# Patient Record
Sex: Male | Born: 1995 | Hispanic: Yes | Marital: Single | Smoking: Former smoker
Health system: Southern US, Community
[De-identification: ages and names within clinical notes are randomized; demographics above are authoritative.]

---
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
## 2019-09-25 ENCOUNTER — Emergency Department (HOSPITAL_COMMUNITY)
Admission: EM | Admit: 2019-09-25 | Discharge: 2019-09-25 | Disposition: A | Discharge status: Home / Self Care | Payer: Self-pay | Attending: Emergency Medicine | Admitting: Emergency Medicine

## 2019-09-25 ENCOUNTER — Other Ambulatory Visit: Payer: Self-pay

## 2019-09-25 ENCOUNTER — Encounter (HOSPITAL_COMMUNITY): Payer: Self-pay | Admitting: Emergency Medicine

## 2019-09-25 ENCOUNTER — Emergency Department (HOSPITAL_COMMUNITY): Payer: Self-pay

## 2019-09-25 ENCOUNTER — Encounter (HOSPITAL_COMMUNITY): Payer: Self-pay

## 2019-09-25 DIAGNOSIS — F17210 Nicotine dependence, cigarettes, uncomplicated: Secondary | ICD-10-CM | POA: Insufficient documentation

## 2019-09-25 DIAGNOSIS — N50819 Testicular pain, unspecified: Secondary | ICD-10-CM | POA: Insufficient documentation

## 2019-09-25 DIAGNOSIS — N50812 Left testicular pain: Secondary | ICD-10-CM | POA: Insufficient documentation

## 2019-09-25 DIAGNOSIS — N50811 Right testicular pain: Secondary | ICD-10-CM | POA: Insufficient documentation

## 2019-09-25 DIAGNOSIS — Z5321 Procedure and treatment not carried out due to patient leaving prior to being seen by health care provider: Secondary | ICD-10-CM | POA: Insufficient documentation

## 2019-09-25 LAB — URINALYSIS, ROUTINE W REFLEX MICROSCOPIC
Bilirubin Urine: NEGATIVE
Glucose, UA: NEGATIVE mg/dL
Hgb urine dipstick: NEGATIVE
Ketones, ur: NEGATIVE mg/dL
Leukocytes,Ua: NEGATIVE
Nitrite: NEGATIVE
Protein, ur: NEGATIVE mg/dL
Specific Gravity, Urine: 1.017 (ref 1.005–1.030)
pH: 6 (ref 5.0–8.0)

## 2019-09-25 LAB — CBC WITH DIFFERENTIAL/PLATELET
Abs Immature Granulocytes: 0.03 10*3/uL (ref 0.00–0.07)
Basophils Absolute: 0 10*3/uL (ref 0.0–0.1)
Basophils Relative: 1 %
Eosinophils Absolute: 0.1 10*3/uL (ref 0.0–0.5)
Eosinophils Relative: 1 %
HCT: 43.1 % (ref 39.0–52.0)
Hemoglobin: 12.8 g/dL — ABNORMAL LOW (ref 13.0–17.0)
Immature Granulocytes: 0 %
Lymphocytes Relative: 21 %
Lymphs Abs: 1.8 10*3/uL (ref 0.7–4.0)
MCH: 25 pg — ABNORMAL LOW (ref 26.0–34.0)
MCHC: 29.7 g/dL — ABNORMAL LOW (ref 30.0–36.0)
MCV: 84 fL (ref 80.0–100.0)
Monocytes Absolute: 0.7 10*3/uL (ref 0.1–1.0)
Monocytes Relative: 8 %
Neutro Abs: 5.9 10*3/uL (ref 1.7–7.7)
Neutrophils Relative %: 69 %
Platelets: 230 10*3/uL (ref 150–400)
RBC: 5.13 MIL/uL (ref 4.22–5.81)
RDW: 14.1 % (ref 11.5–15.5)
WBC: 8.5 10*3/uL (ref 4.0–10.5)
nRBC: 0 % (ref 0.0–0.2)

## 2019-09-25 LAB — BASIC METABOLIC PANEL
Anion gap: 7 (ref 5–15)
BUN: 8 mg/dL (ref 6–20)
CO2: 24 mmol/L (ref 22–32)
Calcium: 9 mg/dL (ref 8.9–10.3)
Chloride: 107 mmol/L (ref 98–111)
Creatinine, Ser: 0.79 mg/dL (ref 0.61–1.24)
GFR calc Af Amer: >60 mL/min (ref >60)
GFR calc non Af Amer: >60 mL/min (ref >60)
Glucose, Bld: 85 mg/dL (ref 70–99)
Potassium: 4.1 mmol/L (ref 3.5–5.1)
Sodium: 138 mmol/L (ref 135–145)

## 2019-09-25 NOTE — ED Triage Notes (Signed)
Onset 4 days ago developed bilateral groin pain and testicle pain. States pain and swelling intermittent. Currently pain 0/10. Denies any urinate or bowel movement complaints.

## 2019-09-25 NOTE — Discharge Instructions (Addendum)
Your blood work was reassuring, your ultrasound did not show any evidence of a testicular torsion or masses, there was a small left epididymal cyst and a small right testicular cyst.  Follow-up with urologist if your pain continues.  In the meantime you can take ibuprofen as prescribed on the bottle for pain.  Get plenty of rest and drink plenty of water.  Please come back to the emergency department for any new worsening concerning symptoms.

## 2019-09-25 NOTE — ED Notes (Signed)
Pt decided to go home 

## 2019-09-25 NOTE — ED Triage Notes (Addendum)
Patient c/o bilateral testicular pain x 2 days. Patient states he had swelling as well 2 days ago, but not now. Patient denies any penile drainage, urinary symptoms, or fever.  Patient was at [facility] last night and had [place] and blood work completed. Patient left AMA due to wait times.

## 2019-09-25 NOTE — ED Provider Notes (Signed)
[facility]-EMERGENCY DEPT Provider Note   CSN: [id number] Arrival date & time: 09/25/19  1351     History Chief Complaint  Patient presents with  . Testicle Pain    Damon Leon is a 24 y.o. male with no prior past medical history that presents the emergency department today for testicular pain for the past week.  Went to [facility] this morning and got blood work and ultrasound done which were both negative.  Did not wait to be seen due to wait times.  States that he had swelling in bilateral testicle 2 days ago, swelling went away yesterday.  Denies any color change, warmth, rash, penile discharge, scrotal masses, nausea, vomiting, chills, fever.  Denies any trauma to the area.  Denies any urinary symptoms, dysuria, hematuria.  States that he is a normal health for this.  Has not been taking any medications for this.  Denies any worsening factors, states that putting a pillow between his legs makes him feel better.  States he has never had pain like this before.  Describes the pain as a sharp pain that comes and goes.  Is not experiencing the pain currently.  States that it only in his testicles.  Does not radiate anywhere.  States that the pain has been lessening over the week.  Denies any chance of STDs, states that he has not had intercourse for over 2 years.  HPI     History reviewed. No pertinent past medical history.  There are no problems to display for this patient.   History reviewed. No pertinent surgical history.     History reviewed. No pertinent family history.  Social History   Tobacco Use  . Smoking status: Current Some Day Smoker    Types: Cigarettes  . Smokeless tobacco: Never Used  Vaping Use  . Vaping Use: Never used  Substance Use Topics  . Alcohol use: No  . Drug use: No    Home Medications Prior to Admission medications   Not on File    Allergies    Patient has no known allergies.  Review of Systems   Review of  Systems  Constitutional: Negative for diaphoresis, fatigue and fever.  Eyes: Negative for visual disturbance.  Respiratory: Negative for shortness of breath.   Cardiovascular: Negative for chest pain.  Gastrointestinal: Negative for nausea and vomiting.  Genitourinary: Positive for testicular pain. Negative for difficulty urinating, discharge, dysuria, enuresis, flank pain, frequency, genital sores, hematuria, penile pain, penile swelling, scrotal swelling and urgency.  Musculoskeletal: Negative for back pain and myalgias.  Skin: Negative for color change, pallor, rash and wound.  Neurological: Negative for syncope, weakness, light-headedness, numbness and headaches.  Psychiatric/Behavioral: Negative for behavioral problems and confusion.    Physical Exam Updated Vital Signs BP 127/78 (BP Location: Right Arm)   Pulse 66   Temp 98.5 F (36.9 C) (Oral)   Resp 15   SpO2 98%   Physical Exam Exam conducted with a chaperone present.  Constitutional:      General: He is not in acute distress.    Appearance: Normal appearance. He is not ill-appearing, toxic-appearing or diaphoretic.  Cardiovascular:     Rate and Rhythm: Normal rate and regular rhythm.     Pulses: Normal pulses.  Pulmonary:     Effort: Pulmonary effort is normal.     Breath sounds: Normal breath sounds.  Abdominal:     Hernia: There is no hernia in the left inguinal area or right inguinal area.  Genitourinary:  Pubic Area: No rash.      Penis: Normal and uncircumcised. No tenderness, discharge or lesions.      Testes: Normal. Cremasteric reflex is present.        Right: Mass or swelling not present.        Left: Mass or swelling not present.     Epididymis:     Right: Normal.     Left: Normal.     Comments: Chaperone present.  Penis normal, no discharge or lesions noted.  Cremaster reflex present.  Testes normal, mild tenderness to bilateral testes.  No erythema or warmth noted.  Only tenderness when pressure  applied.  No tenderness to epididymis bilaterally.  No hernias present bilaterally.  No adenopathy. Musculoskeletal:        General: Normal range of motion.  Lymphadenopathy:     Lower Body: No right inguinal adenopathy. No left inguinal adenopathy.  Skin:    General: Skin is warm and dry.     Capillary Refill: Capillary refill takes less than 2 seconds.  Neurological:     General: No focal deficit present.     Mental Status: He is alert and oriented to person, place, and time.  Psychiatric:        Mood and Affect: Mood normal.        Behavior: Behavior normal.        Thought Content: Thought content normal.     ED Results / Procedures / Treatments   Labs (all labs ordered are listed, but only abnormal results are displayed) Labs Reviewed - No data to display  EKG None  Radiology US SCROTUM W/DOPPLER  Result Date: 09/25/2019 CLINICAL DATA:  Acute bilateral testicular pain. EXAM: SCROTAL ULTRASOUND DOPPLER ULTRASOUND OF THE TESTICLES TECHNIQUE: Complete ultrasound examination of the testicles, epididymis, and other scrotal structures was performed. Color and spectral Doppler ultrasound were also utilized to evaluate blood flow to the testicles. COMPARISON:  None. FINDINGS: Right testicle Measurements: 4.7 x 3.1 x 2.3 cm. No mass or microlithiasis visualized. Several small cysts are noted, the largest measuring 4 mm. Left testicle Measurements: 4.4 x 2.6 x 2.0 cm. No mass or microlithiasis visualized. Right epididymis:  Normal in size and appearance. Left epididymis:  4 mm cyst is noted. Hydrocele:  None visualized. Varicocele:  None visualized. Pulsed Doppler interrogation of both testes demonstrates normal low resistance arterial and venous waveforms bilaterally. IMPRESSION: No evidence of testicular mass or torsion. Small left epididymal cyst. Small right testicular cysts are noted. Electronically Signed   By: Lupita Raider M.D.   On: 09/25/2019 11:32    Procedures Procedures  (including critical care time)  Medications Ordered in ED Medications - No data to display  ED Course  I have reviewed the triage vital signs and the nursing notes.  Pertinent labs & imaging results that were available during my care of the patient were reviewed by me and considered in my medical decision making (see chart for details).    MDM Rules/Calculators/A&P                          Damon Leon is a 24 y.o. male with no prior past medical history that presents the emergency department today for testicular pain for the past week.  Pain has been decreasing over the past week, is not present currently.  Physical exam benign with no signs of testicular torsion, testicular mass, epididymitis.  No signs of infection.  Ultrasound shows  no evidence of testicular mass or torsion, there was small left epididymal cyst and small right testicular cyst.  Urinalysis clear, BMP and CBC without any abnormalities.  Patient rejected STD testing.  Patient follow-up with urologist if testicular pain continues, referral given.  Symptomatic treatment discussed with patient, strict return precautions given.  Patient be discharged.  Patient agreeable.  Doubt need for further emergent work up at this time. I explained the diagnosis and have given explicit precautions to return to the ER including for any other new or worsening symptoms. The patient understands and accepts the medical plan as it's been dictated and I have answered their questions. Discharge instructions concerning home care and prescriptions have been given. The patient is STABLE and is discharged to home in good condition.     Final Clinical Impression(s) / ED Diagnoses Final diagnoses:  Pain in both testicles    Rx / DC Orders ED Discharge Orders    None       Farrel Gordon, PA-C 09/25/19 1543    Charlynne Pander, MD 09/25/19 2322

## 2019-09-26 LAB — URINE CULTURE

## 2019-12-06 ENCOUNTER — Other Ambulatory Visit: Payer: Self-pay

## 2019-12-06 ENCOUNTER — Encounter (HOSPITAL_COMMUNITY): Payer: Self-pay | Admitting: Emergency Medicine

## 2019-12-06 ENCOUNTER — Emergency Department (HOSPITAL_COMMUNITY)
Admission: EM | Admit: 2019-12-06 | Discharge: 2019-12-06 | Disposition: A | Discharge status: Home / Self Care | Payer: Self-pay | Attending: Emergency Medicine | Admitting: Emergency Medicine

## 2019-12-06 DIAGNOSIS — Y92410 Unspecified street and highway as the place of occurrence of the external cause: Secondary | ICD-10-CM | POA: Insufficient documentation

## 2019-12-06 DIAGNOSIS — R519 Headache, unspecified: Secondary | ICD-10-CM | POA: Insufficient documentation
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
## 2019-12-06 NOTE — ED Notes (Signed)
Pt called x 3 .  °

## 2020-03-07 ENCOUNTER — Other Ambulatory Visit: Payer: Self-pay

## 2020-03-07 ENCOUNTER — Emergency Department (HOSPITAL_COMMUNITY)
Admission: EM | Admit: 2020-03-07 | Discharge: 2020-03-07 | Disposition: A | Discharge status: Home / Self Care | Payer: Self-pay | Attending: Emergency Medicine | Admitting: Emergency Medicine

## 2020-03-07 ENCOUNTER — Encounter (HOSPITAL_COMMUNITY): Payer: Self-pay

## 2020-03-07 DIAGNOSIS — Z5321 Procedure and treatment not carried out due to patient leaving prior to being seen by health care provider: Secondary | ICD-10-CM | POA: Insufficient documentation

## 2020-03-07 DIAGNOSIS — L723 Sebaceous cyst: Secondary | ICD-10-CM | POA: Insufficient documentation

## 2020-03-07 NOTE — ED Provider Notes (Signed)
Presenting to bedside to preform initial evaluation and patient was not in room. Per RN, patient eloped from ED prior to being seen.   Jesusita Oka 03/07/20 2229    Tegeler, Canary Brim, MD 03/08/20 [phone]

## 2020-03-07 NOTE — ED Notes (Signed)
Pt eloped. Pt did not inform staff he was leaving.  

## 2020-03-07 NOTE — ED Triage Notes (Signed)
Patient has an abscess on the posterior neck x 4 days. Patient states he has been having pain especially when he turns his head.

## 2020-03-11 ENCOUNTER — Emergency Department (HOSPITAL_COMMUNITY)
Admission: EM | Admit: 2020-03-11 | Discharge: 2020-03-12 | Disposition: A | Discharge status: Home / Self Care | Payer: Self-pay | Attending: Emergency Medicine | Admitting: Emergency Medicine

## 2020-03-11 ENCOUNTER — Other Ambulatory Visit: Payer: Self-pay

## 2020-03-11 DIAGNOSIS — Z7982 Long term (current) use of aspirin: Secondary | ICD-10-CM | POA: Insufficient documentation

## 2020-03-11 DIAGNOSIS — I889 Nonspecific lymphadenitis, unspecified: Secondary | ICD-10-CM | POA: Insufficient documentation

## 2020-03-11 DIAGNOSIS — Z87891 Personal history of nicotine dependence: Secondary | ICD-10-CM | POA: Insufficient documentation

## 2020-03-11 LAB — COMPREHENSIVE METABOLIC PANEL
ALT: 30 U/L (ref 0–44)
AST: 22 U/L (ref 15–41)
Albumin: 4.1 g/dL (ref 3.5–5.0)
Alkaline Phosphatase: 86 U/L (ref 38–126)
Anion gap: 11 (ref 5–15)
BUN: 12 mg/dL (ref 6–20)
CO2: 21 mmol/L — ABNORMAL LOW (ref 22–32)
Calcium: 9.1 mg/dL (ref 8.9–10.3)
Chloride: 105 mmol/L (ref 98–111)
Creatinine, Ser: 0.87 mg/dL (ref 0.61–1.24)
GFR, Estimated: >60 mL/min (ref >60)
Glucose, Bld: 100 mg/dL — ABNORMAL HIGH (ref 70–99)
Potassium: 3.7 mmol/L (ref 3.5–5.1)
Sodium: 137 mmol/L (ref 135–145)
Total Bilirubin: 0.5 mg/dL (ref 0.3–1.2)
Total Protein: 7 g/dL (ref 6.5–8.1)

## 2020-03-11 LAB — CBC WITH DIFFERENTIAL/PLATELET
Abs Immature Granulocytes: 0.05 10*3/uL (ref 0.00–0.07)
Basophils Absolute: 0.1 10*3/uL (ref 0.0–0.1)
Basophils Relative: 1 %
Eosinophils Absolute: 0.3 10*3/uL (ref 0.0–0.5)
Eosinophils Relative: 3 %
HCT: 41.5 % (ref 39.0–52.0)
Hemoglobin: 12.8 g/dL — ABNORMAL LOW (ref 13.0–17.0)
Immature Granulocytes: 1 %
Lymphocytes Relative: 23 %
Lymphs Abs: 2.2 10*3/uL (ref 0.7–4.0)
MCH: 25.1 pg — ABNORMAL LOW (ref 26.0–34.0)
MCHC: 30.8 g/dL (ref 30.0–36.0)
MCV: 81.5 fL (ref 80.0–100.0)
Monocytes Absolute: 1 10*3/uL (ref 0.1–1.0)
Monocytes Relative: 10 %
Neutro Abs: 6.2 10*3/uL (ref 1.7–7.7)
Neutrophils Relative %: 62 %
Platelets: 243 10*3/uL (ref 150–400)
RBC: 5.09 MIL/uL (ref 4.22–5.81)
RDW: 13.6 % (ref 11.5–15.5)
WBC: 9.8 10*3/uL (ref 4.0–10.5)
nRBC: 0 % (ref 0.0–0.2)

## 2020-03-11 NOTE — ED Triage Notes (Signed)
Pt reports that he has a headache, 2  knots to right side of neck below right ear. Denies fever, nausea, vomiting.

## 2020-03-12 ENCOUNTER — Emergency Department (HOSPITAL_COMMUNITY): Payer: Self-pay

## 2020-03-12 MED ORDER — AMOXICILLIN-POT CLAVULANATE 875-125 MG PO TABS
1.0000 | ORAL_TABLET | Freq: Once | ORAL | Status: AC
  Administered 2020-03-12: 1 via ORAL
  Filled 2020-03-12: qty 1

## 2020-03-12 MED ORDER — IOHEXOL 300 MG/ML  SOLN
75.0000 mL | Freq: Once | INTRAMUSCULAR | Status: AC | PRN
  Administered 2020-03-12: 75 mL via INTRAVENOUS

## 2020-03-12 MED ORDER — AMOXICILLIN-POT CLAVULANATE 875-125 MG PO TABS: 1.0000 | ORAL_TABLET | Freq: Two times a day (BID) | ORAL | 0 refills | Status: AC

## 2020-03-12 NOTE — ED Provider Notes (Signed)
[facility] EMERGENCY DEPARTMENT Provider Note   CSN: [id number] Arrival date & time: 03/11/20  2138     History Chief Complaint  Patient presents with  . Lymphadenopathy    Damon Leon is a 25 y.o. male.  25 year old male presents emerged from today with a few weeks of progressively worsening swelling and pain to the right side of his head.  Patient states that he has not had this before.  Patient denies any fevers or difficulty swallowing.  Patient denies any ear pain.  Denies any dental pain.  No other complaints. No weight loss or weight gain recently.         No past medical history on file.  There are no problems to display for this patient.   No past surgical history on file.     Family History  Problem Relation Age of Onset  . Healthy Mother   . Healthy Father     Social History   Tobacco Use  . Smoking status: Former Smoker    Types: Cigarettes  . Smokeless tobacco: Never Used  Vaping Use  . Vaping Use: Never used  Substance Use Topics  . Alcohol use: Not Currently  . Drug use: No    Home Medications Prior to Admission medications   Medication Sig Start Date End Date Taking? Authorizing Provider  amoxicillin-clavulanate (AUGMENTIN) 875-125 MG tablet Take 1 tablet by mouth 2 (two) times daily for 14 days. One po bid x 7 days 03/12/20 03/26/20 Yes Elektra Wartman, Barbara Cower, MD  Aspirin-Acetaminophen (GOODYS BODY PAIN PO) Take 1 packet by mouth daily as needed for pain.   Yes [provider]    Allergies    Patient has no known allergies.  Review of Systems   Review of Systems  All other systems reviewed and are negative.   Physical Exam Updated Vital Signs BP 125/63 (BP Location: Right Arm)   Pulse 63   Temp 98.3 F (36.8 C) (Oral)   Resp 18   SpO2 96%   Physical Exam Vitals and nursing note reviewed.  Constitutional:      Appearance: He is well-developed and well-nourished.  HENT:     Head: Normocephalic and  atraumatic.     Nose: Nose normal. No congestion or rhinorrhea.     Mouth/Throat:     Mouth: Mucous membranes are moist.     Pharynx: Oropharynx is clear.  Eyes:     Pupils: Pupils are equal, round, and reactive to light.  Cardiovascular:     Rate and Rhythm: Normal rate.  Pulmonary:     Effort: Pulmonary effort is normal. No respiratory distress.  Abdominal:     General: Abdomen is flat. There is no distension.  Musculoskeletal:        General: No swelling or tenderness. Normal range of motion.     Cervical back: Normal range of motion.  Lymphadenopathy:     Cervical: Cervical adenopathy (right ) present.  Skin:    General: Skin is warm and dry.     Coloration: Skin is not jaundiced or pale.  Neurological:     General: No focal deficit present.     Mental Status: He is alert.     ED Results / Procedures / Treatments   Labs (all labs ordered are listed, but only abnormal results are displayed) Labs Reviewed  CBC WITH DIFFERENTIAL/PLATELET - Abnormal; Notable for the following components:      Result Value   Hemoglobin 12.8 (*)  MCH 25.1 (*)    All other components within normal limits  COMPREHENSIVE METABOLIC PANEL - Abnormal; Notable for the following components:   CO2 21 (*)    Glucose, Bld 100 (*)    All other components within normal limits    EKG None  Radiology CT Soft Tissue Neck W Contrast  Result Date: 03/12/2020 CLINICAL DATA:  Neck cellulitis. Headache with 2 knots behind the right ear. EXAM: CT NECK WITH CONTRAST TECHNIQUE: Multidetector CT imaging of the neck was performed using the standard protocol following the bolus administration of intravenous contrast. CONTRAST:  78mL OMNIPAQUE IOHEXOL 300 MG/ML  SOLN COMPARISON:  None. FINDINGS: Pharynx and larynx: No evidence of mass or swelling. No marked thickening of Waldeyer's ring. Salivary glands: No noted mass or inflammation. Thyroid: Normal Lymph nodes: Contiguous enlarged lymph nodes in the right  jugular chain, the largest in the level IIB neck measuring 19 mm. Adjacent fat is reticulated with fluid density seen tracking between the musculature, favoring an inflammatory process/adenitis. The nodes show prominent but fairly homogeneous enhancement. Enlarged lymph nodes continue into the low posterior triangle. Vascular: Negative Limited intracranial: Negative Visualized orbits: Negative Mastoids and visualized paranasal sinuses: Clear Skeleton: Negative Upper chest: Small nodule versus is branching vessel in the left upper lobe on the lowest slice, ~ 7 mm. IMPRESSION: 1. Unilateral right lymphadenopathy with regional fat and muscle edema favoring adenitis. Given the degree of swelling, pattern, and lack of visible primary cause - recommend close clinical follow-up after treatment to exclude lymphoma. 2. Nodule versus branching vessel in the left upper lobe on the lowest slice, recommend chest x-ray at time of follow-up. Electronically Signed   By: Marnee Spring M.D.   On: 03/12/2020 05:36   CT Temporal Bones W Contrast  Result Date: 03/12/2020 CLINICAL DATA:  Headache with 2 knots by the right ear. EXAM: CT TEMPORAL BONES WITH CONTRAST TECHNIQUE: Axial and coronal plane CT imaging of the petrous temporal bones was performed with thin-collimation image reconstruction after intravenous contrast administration. Multiplanar CT image reconstructions were also generated. CONTRAST:  9mL OMNIPAQUE IOHEXOL 300 MG/ML  SOLN COMPARISON:  None. FINDINGS: Right temporal bone: The pina and external auditory canal are unremarkable. The tympanic membrane is thin. The ossicles are normally formed and aligned. Mastoid and middle ear is well pneumatized and aerated. The labyrinthine structures appear normally formed and bony covered. Internal auditory canal is normal in size. The vestibular aqueduct is normal in size. The carotid and sigmoid sinus are bone covered. Unremarkable facial nerve canal. Left temporal bone: The  pina and external auditory canal are unremarkable. The tympanic membrane is thin. The ossicles are normally formed and aligned. Mastoid and middle ear is well pneumatized and aerated. The labyrinth structures appear normally formed and bony covered. Internal auditory canal is normal in size. The vestibular aqueduct is normal in size. The carotid and sigmoid sinus are bone covered. Unremarkable facial nerve canal. IMPRESSION: Negative temporal bone CT.  No mastoiditis or otitis. Electronically Signed   By: Marnee Spring M.D.   On: 03/12/2020 05:30    Procedures Procedures   Medications Ordered in ED Medications  iohexol (OMNIPAQUE) 300 MG/ML solution 75 mL (75 mLs Intravenous Contrast Given 03/12/20 0517)  amoxicillin-clavulanate (AUGMENTIN) 875-125 MG per tablet 1 tablet (1 tablet Oral Given 03/12/20 0554)    ED Course  I have reviewed the triage vital signs and the nursing notes.  Pertinent labs & imaging results that were available during my care of the patient  were reviewed by me and considered in my medical decision making (see chart for details).    MDM Rules/Calculators/A&P                          No evidence of mastoiditis.  It does appear he has a significant enlarged lymph node.  Concern for possible lymphoma versus infection.  I had very direct conversation with the patient that were and try a course of antibiotics but no matter what he need to follow-up with ear nose and throat to ensure improvement and to make sure he does not need a biopsy or further imaging for the same.  He voiced understanding.  First dose of antibiotics given here.  Referral placed in outpatient orders.  Final Clinical Impression(s) / ED Diagnoses Final diagnoses:  Lymphadenitis    Rx / DC Orders ED Discharge Orders         Ordered    Ambulatory referral to ENT        03/12/20 0545    amoxicillin-clavulanate (AUGMENTIN) 875-125 MG tablet  2 times daily        03/12/20 0547           Laryn Venning,  Barbara Cower, MD 03/12/20 7510

## 2020-03-18 ENCOUNTER — Ambulatory Visit (INDEPENDENT_AMBULATORY_CARE_PROVIDER_SITE_OTHER): Payer: Self-pay | Admitting: Otolaryngology

## 2020-03-25 ENCOUNTER — Ambulatory Visit (INDEPENDENT_AMBULATORY_CARE_PROVIDER_SITE_OTHER): Payer: Medicaid Other | Admitting: Otolaryngology

## 2021-05-08 ENCOUNTER — Other Ambulatory Visit: Payer: Self-pay

## 2021-05-08 ENCOUNTER — Emergency Department (HOSPITAL_BASED_OUTPATIENT_CLINIC_OR_DEPARTMENT_OTHER)
Admission: EM | Admit: 2021-05-08 | Discharge: 2021-05-08 | Disposition: A | Discharge status: Home / Self Care | Payer: Medicaid Other | Attending: Emergency Medicine | Admitting: Emergency Medicine

## 2021-05-08 ENCOUNTER — Encounter (HOSPITAL_BASED_OUTPATIENT_CLINIC_OR_DEPARTMENT_OTHER): Payer: Self-pay

## 2021-05-08 DIAGNOSIS — K625 Hemorrhage of anus and rectum: Secondary | ICD-10-CM | POA: Insufficient documentation

## 2021-05-08 LAB — CBC
HCT: 44 % (ref 39.0–52.0)
Hemoglobin: 13.7 g/dL (ref 13.0–17.0)
MCH: 25.3 pg — ABNORMAL LOW (ref 26.0–34.0)
MCHC: 31.1 g/dL (ref 30.0–36.0)
MCV: 81.2 fL (ref 80.0–100.0)
Platelets: 252 10*3/uL (ref 150–400)
RBC: 5.42 MIL/uL (ref 4.22–5.81)
RDW: 14.3 % (ref 11.5–15.5)
WBC: 9.5 10*3/uL (ref 4.0–10.5)
nRBC: 0 % (ref 0.0–0.2)

## 2021-05-08 LAB — COMPREHENSIVE METABOLIC PANEL
ALT: 28 U/L (ref 0–44)
AST: 15 U/L (ref 15–41)
Albumin: 4.7 g/dL (ref 3.5–5.0)
Alkaline Phosphatase: 74 U/L (ref 38–126)
Anion gap: 8 (ref 5–15)
BUN: 16 mg/dL (ref 6–20)
CO2: 27 mmol/L (ref 22–32)
Calcium: 10.2 mg/dL (ref 8.9–10.3)
Chloride: 104 mmol/L (ref 98–111)
Creatinine, Ser: 0.78 mg/dL (ref 0.61–1.24)
GFR, Estimated: >60 mL/min (ref >60)
Glucose, Bld: 96 mg/dL (ref 70–99)
Potassium: 4 mmol/L (ref 3.5–5.1)
Sodium: 139 mmol/L (ref 135–145)
Total Bilirubin: 0.4 mg/dL (ref 0.3–1.2)
Total Protein: 7.3 g/dL (ref 6.5–8.1)

## 2021-05-08 LAB — OCCULT BLOOD X 1 CARD TO LAB, STOOL: Fecal Occult Bld: NEGATIVE

## 2021-05-08 NOTE — Discharge Instructions (Signed)
Return back to the ER if your bleeding becomes worse or heavier. ? ?Otherwise follow-up with gastroenterology physician.  Please call their office to schedule an appointment for the next week. ?

## 2021-05-08 NOTE — ED Triage Notes (Signed)
Pt reports having a soft BM on Saturday followed by a gush of gross blood. Pt had a normal BM Sunday morning, but noted bleeding again last night. Pt denies any N/V, abd pain, or rectal pain.  ?

## 2021-05-08 NOTE — ED Provider Notes (Signed)
?[facility] EMERGENCY DEPT ?Provider Note ? ? ?CSN: [id number] ?Arrival date & time: 05/08/21  1030 ? ?  ? ?History ? ?Chief Complaint  ?Patient presents with  ? GI Bleeding  ? ? ?Damon Leon is a 26 y.o. male. ? ?Patient presents complaint of GI bleed.  He is noticed a bloody stool 2 days ago after a bowel movement.  And then a bloody stool again today after bowel movement.  Denies any abdominal pain denies any fevers or cough or vomiting or diarrhea.  Denies any aspirin or ibuprofen use.  Denies any prior history of GI bleeding. ? ? ?  ? ?Home Medications ?Prior to Admission medications   ?Medication Sig Start Date End Date Taking? Authorizing Provider  ?Aspirin-Acetaminophen (GOODYS BODY PAIN PO) Take 1 packet by mouth daily as needed for pain.    [provider]  ?   ? ?Allergies    ?Patient has no known allergies.   ? ?Review of Systems   ?Review of Systems  ?Constitutional:  Negative for fever.  ?HENT:  Negative for ear pain and sore throat.   ?Eyes:  Negative for pain.  ?Respiratory:  Negative for cough.   ?Cardiovascular:  Negative for chest pain.  ?Gastrointestinal:  Negative for abdominal pain.  ?Genitourinary:  Negative for flank pain.  ?Musculoskeletal:  Negative for back pain.  ?Skin:  Negative for color change and rash.  ?Neurological:  Negative for syncope.  ?All other systems reviewed and are negative. ? ?Physical Exam ?Updated Vital Signs ?BP 131/89   Pulse 65   Temp 98.3 ?F (36.8 ?C)   Resp 18   Ht 5\' 7"  (1.702 m)   Wt 77.1 kg   SpO2 97%   BMI 26.63 kg/m?  ?Physical Exam ?Constitutional:   ?   Appearance: He is well-developed.  ?HENT:  ?   Head: Normocephalic.  ?   Nose: Nose normal.  ?Eyes:  ?   Extraocular Movements: Extraocular movements intact.  ?Cardiovascular:  ?   Rate and Rhythm: Normal rate.  ?Pulmonary:  ?   Effort: Pulmonary effort is normal.  ?Abdominal:  ?   Tenderness: There is no abdominal tenderness. There is no guarding or rebound.   ?Genitourinary: ?   Rectum: Guaiac result negative.  ?   Comments: Rectal exam performed with nursing chaperone present. ?Skin: ?   Coloration: Skin is not jaundiced.  ?Neurological:  ?   Mental Status: He is alert. Mental status is at baseline.  ? ? ?ED Results / Procedures / Treatments   ?Labs ?(all labs ordered are listed, but only abnormal results are displayed) ?Labs Reviewed  ?CBC - Abnormal; Notable for the following components:  ?    Result Value  ? MCH 25.3 (*)   ? All other components within normal limits  ?COMPREHENSIVE METABOLIC PANEL  ?OCCULT BLOOD X 1 CARD TO LAB, STOOL  ?POC OCCULT BLOOD, ED  ? ? ?EKG ?None ? ?Radiology ?No results found. ? ?Procedures ?Procedures  ? ? ?Medications Ordered in ED ?Medications - No data to display ? ?ED Course/ Medical Decision Making/ A&P ?  ?                        ?Medical Decision Making ?Amount and/or Complexity of Data Reviewed ?Labs: ordered. ? ? ?Chart review shows ER visit for lymphadenitis March 11, 2020. ? ?Cardiac monitoring showing sinus rhythm. ? ?Labs show normal hemoglobin normal chemistry. ? ?Stool guaiac test is negative brown  appearing stool. ? ?Patient advised outpatient follow-up with GI physician in a week.  Advised immediate return for worsening bleeding fevers pain or any additional concerns. ? ? ? ? ? ? ? ?Final Clinical Impression(s) / ED Diagnoses ?Final diagnoses:  ?Rectal bleeding  ? ? ?Rx / DC Orders ?ED Discharge Orders   ? ? None  ? ?  ? ? ?  ?Luna Fuse, MD ?05/08/21 1408 ? ?

## 2021-05-31 ENCOUNTER — Ambulatory Visit (HOSPITAL_BASED_OUTPATIENT_CLINIC_OR_DEPARTMENT_OTHER): Payer: Medicaid Other | Admitting: Family Medicine

## 2021-07-21 ENCOUNTER — Ambulatory Visit (HOSPITAL_BASED_OUTPATIENT_CLINIC_OR_DEPARTMENT_OTHER): Payer: Self-pay | Admitting: Family Medicine

## 2021-07-28 ENCOUNTER — Encounter (HOSPITAL_BASED_OUTPATIENT_CLINIC_OR_DEPARTMENT_OTHER): Payer: Self-pay | Admitting: Family Medicine

## 2021-11-05 IMAGING — CT CT NECK W/ CM
4 of 6 series · 12 of 33 positions shown, 14 images · IV contrast (APPLIED)
Comparison: None.

CLINICAL DATA: Neck cellulitis. Headache with 2 knots behind the
right ear.

EXAM:
CT NECK WITH CONTRAST
TECHNIQUE: Multidetector CT imaging of the neck was performed using the
standard protocol following the bolus administration of intravenous
contrast.
CONTRAST:  75mL OMNIPAQUE IOHEXOL 300 MG/ML  SOLN

[Series 3: axial neck · axial · 0.53mm/px · z∈[-238,-166]mm · 2 of 108 slices shown]
[im 36/108  bone]
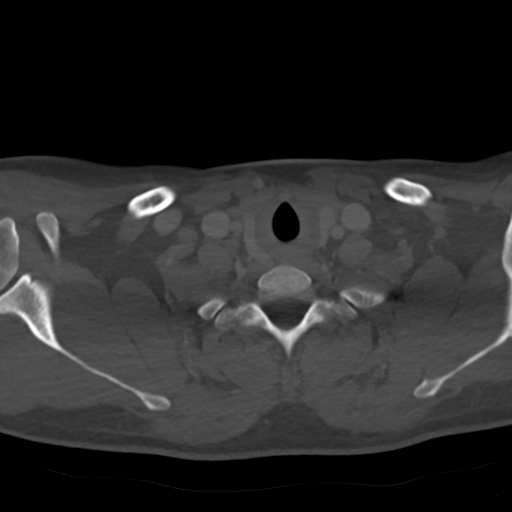
[im 72/108  bone]
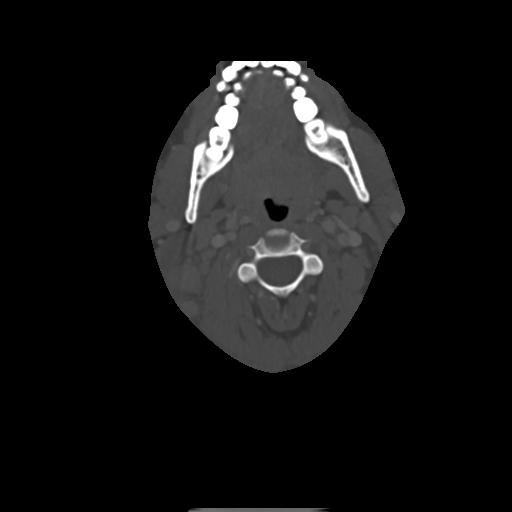

[Series 6: sag neck · sagittal · 0.43mm/px · 5 of 128 slices shown, 6 images]
[im 43/128  bone]
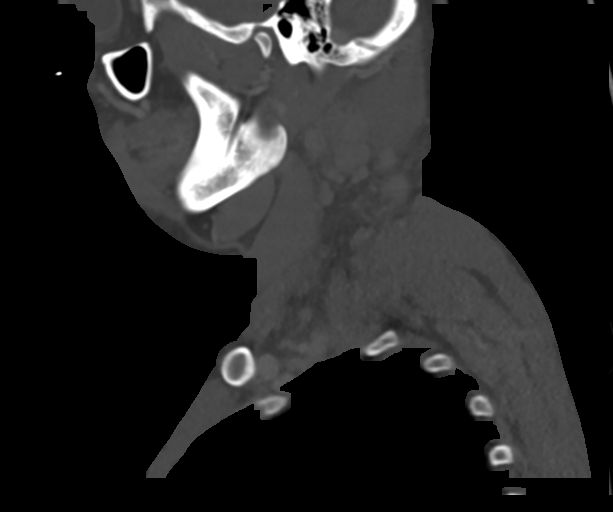
[im 53/128  bone]
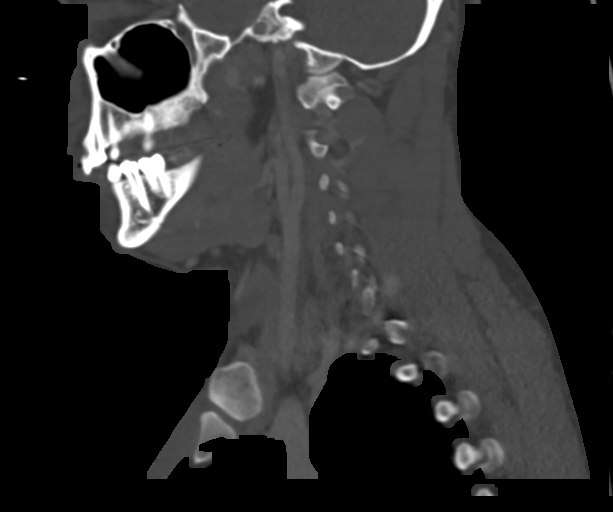
[im 64/128  soft-tissue]
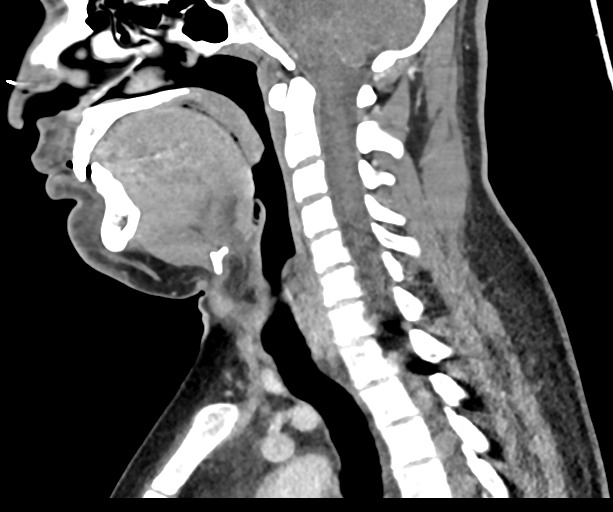
[im 64/128  bone]
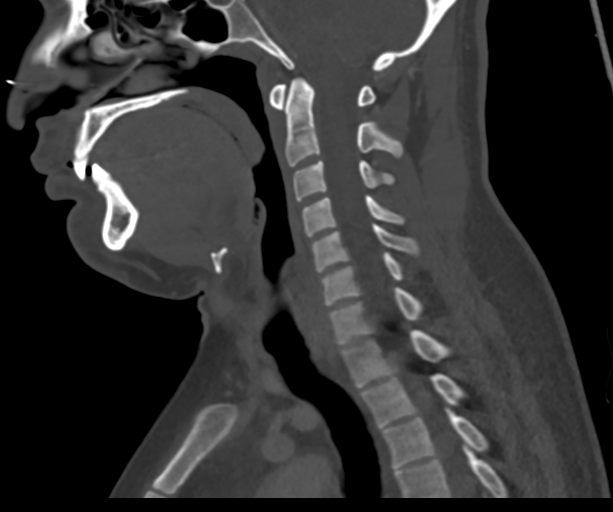
[im 75/128  bone]
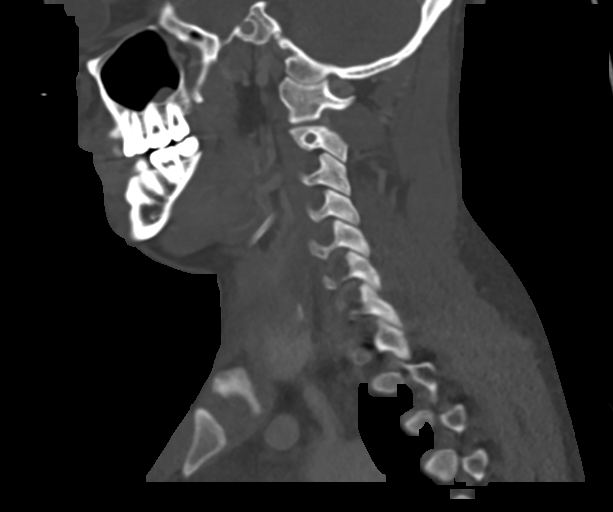
[im 85/128  bone]
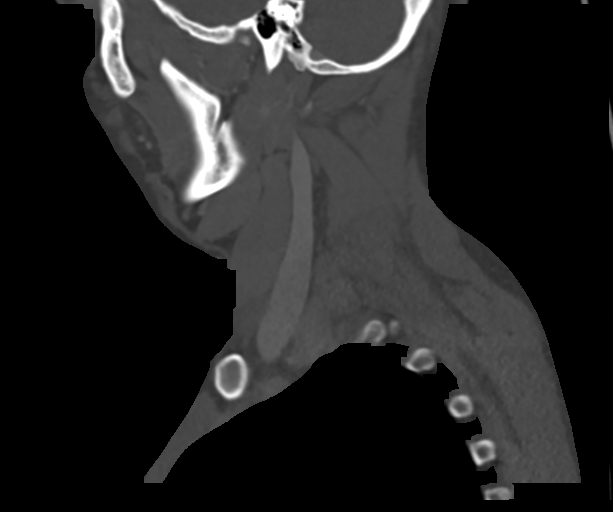

[Series 7: cor neck · coronal · 0.45mm/px · 3 of 130 slices shown]
[im 26/130  bone]
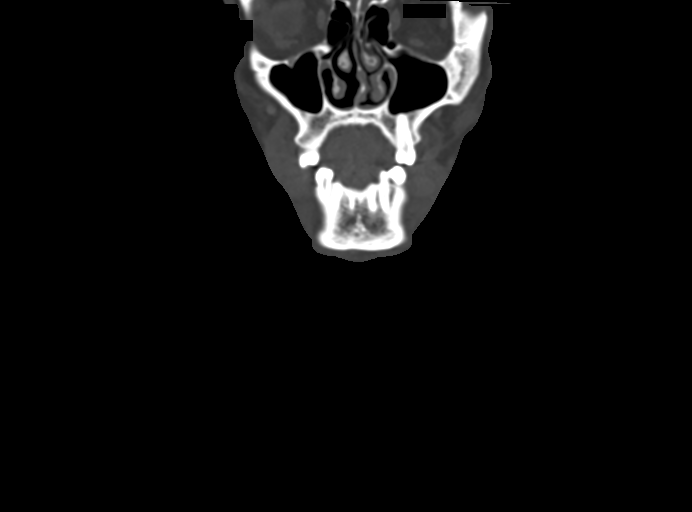
[im 52/130  bone]
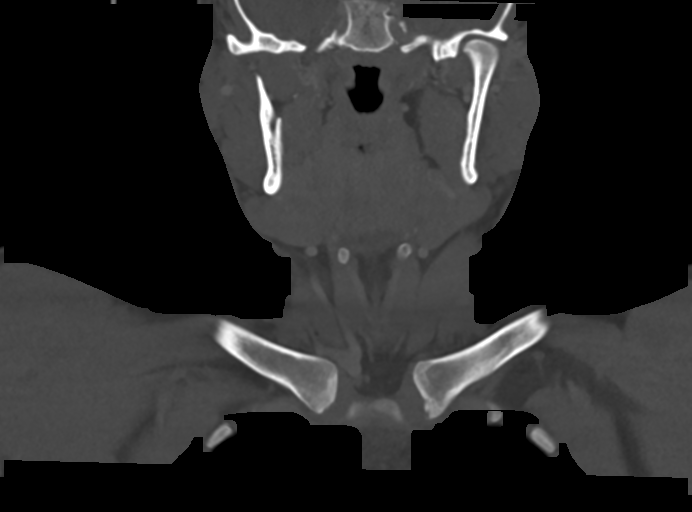
[im 78/130  bone]
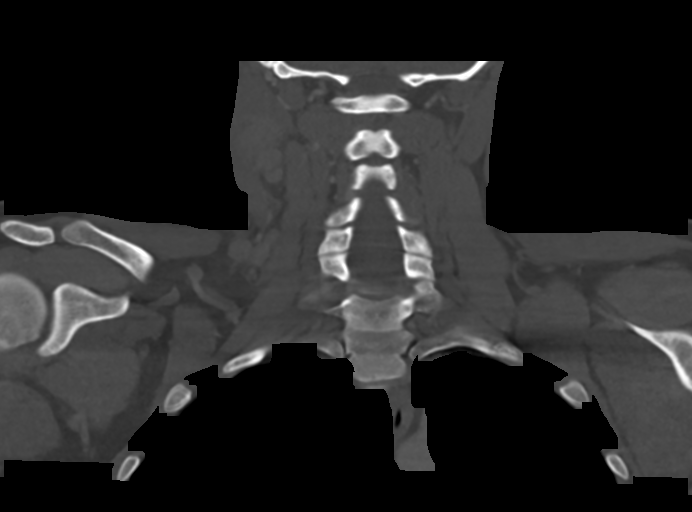

[Series 8: ax oropharynx · axial · 0.51mm/px · z∈[-246,-170]mm · 2 of 114 slices shown, 3 images]
[im 38/114  soft-tissue]
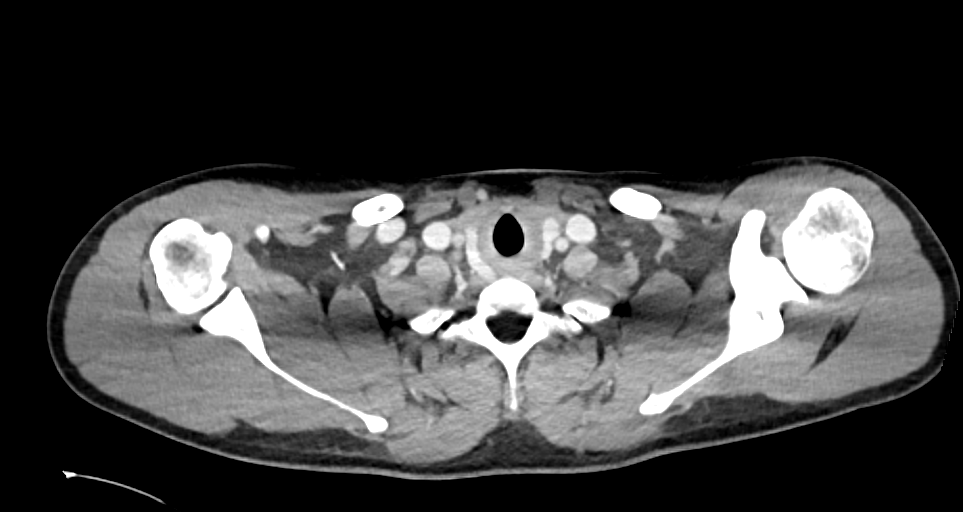
[im 38/114  bone]
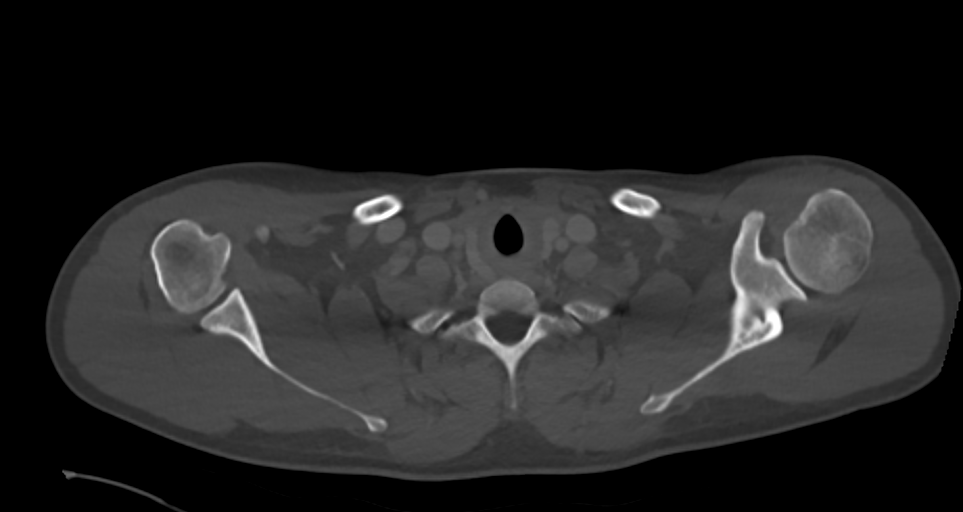
[im 76/114  bone]
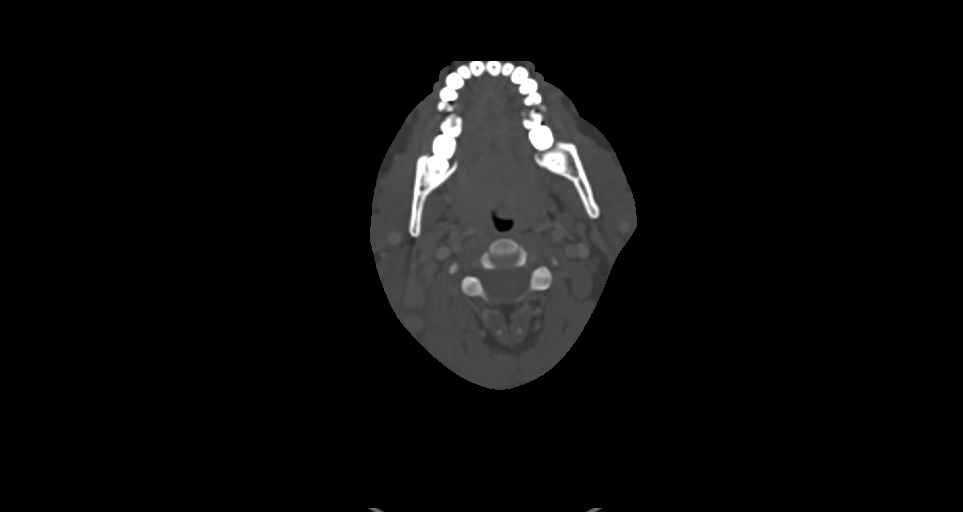

[12 of 33 positions shown; findings below may reference images not displayed]

FINDINGS: Pharynx and larynx: No evidence of mass or swelling. No marked
thickening of Waldeyer's ring.

Salivary glands: No noted mass or inflammation.

Thyroid: Normal

Lymph nodes: Contiguous enlarged lymph nodes in the right jugular
chain, the largest in the level IIB neck measuring 19 mm. Adjacent
fat is reticulated with fluid density seen tracking between the
musculature, favoring an inflammatory process/adenitis. The nodes
show prominent but fairly homogeneous enhancement. Enlarged lymph
nodes continue into the low posterior triangle.

Vascular: Negative

Limited intracranial: Negative

Visualized orbits: Negative

Mastoids and visualized paranasal sinuses: Clear

Skeleton: Negative

Upper chest: Small nodule versus is branching vessel in the left
upper lobe on the lowest slice, ~ 7 mm.
IMPRESSION: 1. Unilateral right lymphadenopathy with regional fat and muscle
edema favoring adenitis. Given the degree of swelling, pattern, and
lack of visible primary cause - recommend close clinical follow-up
after treatment to exclude lymphoma.
2. Nodule versus branching vessel in the left upper lobe on the
lowest slice, recommend chest x-ray at time of follow-up.
